# Patient Record
Sex: Female | Born: 2000 | Marital: Single | Smoking: Never smoker
Health system: Southern US, Community
[De-identification: ages and names within clinical notes are randomized; demographics above are authoritative.]

## PROBLEM LIST (undated history)

## (undated) DIAGNOSIS — N946 Dysmenorrhea, unspecified: Secondary | ICD-10-CM

## (undated) DIAGNOSIS — F419 Anxiety disorder, unspecified: Secondary | ICD-10-CM

## (undated) DIAGNOSIS — N926 Irregular menstruation, unspecified: Secondary | ICD-10-CM

## (undated) HISTORY — DX: Anxiety disorder, unspecified: F41.9

## (undated) HISTORY — DX: Irregular menstruation, unspecified: N92.6

## (undated) HISTORY — DX: Dysmenorrhea, unspecified: N94.6

## (undated) HISTORY — PX: WISDOM TOOTH EXTRACTION: SHX21

---

## 2019-12-28 DIAGNOSIS — Z03818 Encounter for observation for suspected exposure to other biological agents ruled out: Secondary | ICD-10-CM | POA: Diagnosis not present

## 2020-05-22 DIAGNOSIS — Z20822 Contact with and (suspected) exposure to covid-19: Secondary | ICD-10-CM | POA: Diagnosis not present

## 2020-06-06 DIAGNOSIS — Z20822 Contact with and (suspected) exposure to covid-19: Secondary | ICD-10-CM | POA: Diagnosis not present

## 2020-06-15 DIAGNOSIS — Z20822 Contact with and (suspected) exposure to covid-19: Secondary | ICD-10-CM | POA: Diagnosis not present

## 2020-07-01 DIAGNOSIS — Z111 Encounter for screening for respiratory tuberculosis: Secondary | ICD-10-CM | POA: Diagnosis not present

## 2020-07-04 DIAGNOSIS — Z111 Encounter for screening for respiratory tuberculosis: Secondary | ICD-10-CM | POA: Diagnosis not present

## 2020-10-28 DIAGNOSIS — Z111 Encounter for screening for respiratory tuberculosis: Secondary | ICD-10-CM | POA: Diagnosis not present

## 2020-10-31 DIAGNOSIS — Z111 Encounter for screening for respiratory tuberculosis: Secondary | ICD-10-CM | POA: Diagnosis not present

## 2021-03-31 DIAGNOSIS — R519 Headache, unspecified: Secondary | ICD-10-CM | POA: Diagnosis not present

## 2021-03-31 DIAGNOSIS — Z20822 Contact with and (suspected) exposure to covid-19: Secondary | ICD-10-CM | POA: Diagnosis not present

## 2021-03-31 DIAGNOSIS — J069 Acute upper respiratory infection, unspecified: Secondary | ICD-10-CM | POA: Diagnosis not present

## 2021-07-10 ENCOUNTER — Other Ambulatory Visit: Payer: Self-pay

## 2021-07-10 ENCOUNTER — Ambulatory Visit (INDEPENDENT_AMBULATORY_CARE_PROVIDER_SITE_OTHER): Payer: BC Managed Care – PPO | Admitting: Obstetrics

## 2021-07-10 ENCOUNTER — Encounter: Payer: Self-pay | Admitting: Obstetrics

## 2021-07-10 ENCOUNTER — Other Ambulatory Visit (HOSPITAL_COMMUNITY)
Admission: RE | Admit: 2021-07-10 | Discharge: 2021-07-10 | Disposition: A | Discharge status: Home / Self Care | Payer: BC Managed Care – PPO | Source: Ambulatory Visit | Attending: Obstetrics | Admitting: Obstetrics

## 2021-07-10 VITALS — BP 118/79 | HR 66 | Ht 67.0 in | Wt 154.1 lb

## 2021-07-10 DIAGNOSIS — Z124 Encounter for screening for malignant neoplasm of cervix: Secondary | ICD-10-CM | POA: Insufficient documentation

## 2021-07-10 DIAGNOSIS — N6310 Unspecified lump in the right breast, unspecified quadrant: Secondary | ICD-10-CM | POA: Diagnosis not present

## 2021-07-10 DIAGNOSIS — Z01419 Encounter for gynecological examination (general) (routine) without abnormal findings: Secondary | ICD-10-CM

## 2021-07-10 MED ORDER — SERTRALINE HCL 100 MG PO TABS: 150.0000 mg | ORAL_TABLET | Freq: Every day | ORAL | 5 refills | Status: DC

## 2021-07-10 NOTE — Progress Notes (Signed)
SUBJECTIVE ? ?HPI ? ?Julia Mcdaniel is a 21 y.o.-year-old female who presents to establish care and for an annual gynecological exam with Pap today. She is on continuously cycled Nextstellis which is working well for her right now. She does note that if she does not take her dose at the exact same time each day, she will have spotting. She states that she has a a lot of vaginal discharge, but there is no odor or itching. She has been taking sertraline for her anxiety. She decreased her dose to 100 mg, but she would like to return to 150 mg. She has no other questions or concerns today. ? ?Medical/Surgical History ?Past Medical History:  ?Diagnosis Date  ? Anxiety   ? Irregular periods/menstrual cycles   ? Painful menstrual periods   ? ?History reviewed. No pertinent surgical history. ?Family History ?Mother - hysterectomy, thyroid issues, bladder problems ?Paternal aunt -Breast cancer in early 79s ? 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 ?Obstetric History ?OB History   ? ? Gravida  ?0  ? Para  ?0  ? Term  ?0  ? Preterm  ?0  ? AB  ?0  ? Living  ?0  ?  ? ? SAB  ?0  ? IAB  ?0  ? Ectopic  ?0  ? Multiple  ?0  ? Live Births  ?0  ?   ?  ?  ?  ? ?GYN/Menstrual History ?Patient's last menstrual period was 06/16/2021 (exact date). ?Does not usually have period with OCPs ?Last Pap: unsure ?Contraception: OCPs ? ?Prevention ?Endorses regular dental and eye exams ?Mammogram: at 50 ?Flu shot/vaccines: up to date ? ?Current Medications ?Outpatient Medications Prior to Visit  ?Medication Sig  ? Ascorbic Acid (VITAMIN C) 100 MG tablet Take 100 mg by mouth daily.  ? Drospirenone-Estetrol (NEXTSTELLIS) 3-14.2 MG TABS Take by mouth.  ? Multiple Vitamins-Minerals (WOMENS MULTIVITAMIN + COLLAGEN PO) Take by mouth.  ? Probiotic Product (PROBIOTIC PO) Take by mouth.  ? sertraline  (ZOLOFT) 100 MG tablet Take 100 mg by mouth daily.  ? ?No facility-administered medications prior to visit.  ?  ? ? Upstream - 07/10/21 1435   ? ?  ? Pregnancy Intention Screening  ? Does the patient want to become pregnant in the next year? No   ? Does the patient's partner want to become pregnant in the next year? No   ? Would the patient like to discuss contraceptive options today? No   ?  ? Contraception Wrap Up  ? Current Method Oral Contraceptive   ? End Method Oral Contraceptive   ? ?  ?  ? ?  ? ?The pregnancy intention screening data noted above was reviewed. Potential methods of contraception were discussed. The patient elected to proceed with Oral Contraceptive.  ? ?ROS ?History obtained from the patient ?General ROS: negative for - chills, fatigue, or night sweats ?Psychological ROS: positive for - anxiety ?Ophthalmic ROS: negative for - blurry vision ?ENT ROS: negative for - headaches, sore throat, or visual changes ?Hematological and Lymphatic ROS: negative for - bleeding problems or swollen lymph nodes ?Endocrine ROS: negative for - breast changes, palpitations, or polydipsia/polyuria ?Breast ROS: negative for breast lumps ?Respiratory ROS: no cough, shortness of breath, or wheezing ?Cardiovascular ROS: no chest pain or dyspnea on exertion ?Gastrointestinal ROS: no abdominal pain, change in bowel habits, or black or bloody stools ?Genito-Urinary  ROS: no dysuria, trouble voiding, or hematuria ?Musculoskeletal ROS: negative for - joint pain or muscle pain ?Dermatological ROS: negative for dry skin and rash ? ?   ? View : No data to display.  ?  ?  ?  ?  ? ?OBJECTIVE ? ?Last Weight  Most recent update: 07/10/2021  2:32 PM  ? ? Weight  ?69.9 kg (154 lb 1.6 oz)  ?      ? ?  ?  ?Body mass index is 24.14 kg/m?.  ? ? ?BP 118/79   Pulse 66   Ht 5\' 7"  (1.702 m)   Wt 154 lb 1.6 oz (69.9 kg)   LMP 06/16/2021 (Exact Date)   BMI 24.14 kg/m?  ?General appearance: alert, cooperative, and appears stated age ?Head:  Normocephalic, without obvious abnormality, atraumatic ?Neck: no adenopathy, supple, symmetrical, trachea midline, and thyroid not enlarged, symmetric, no tenderness/mass/nodules ?Lungs: clear to auscultation bilaterally ?Breasts: No nipple retraction or dimpling, No nipple discharge or bleeding, No axillary or supraclavicular adenopathy, positive findings: 2x1 cm, smooth, rubbery, and mobile nodule located on the right below the areola ?Heart: regular rate and rhythm, S1, S2 normal, no murmur, click, rub or gallop ?Abdomen: soft, non-tender; bowel sounds normal; no masses,  no organomegaly ?Pelvic: cervix normal in appearance, external genitalia normal, no cervical motion tenderness, rectovaginal septum normal, and vagina normal with white watery discharge noted. Pap collected. ?Extremities: extremities normal, atraumatic, no cyanosis or edema ?Pulses: 2+ and symmetric ?Skin: Skin color, texture, turgor normal. No rashes or lesions ?Lymph nodes: Cervical, supraclavicular, and axillary nodes normal. ? ?ASSESSMENT  ?1) Annual exam ?2) Pap due ?3) Breast nodule, right side ?4) Anxiety ? ?PLAN ?1) Physical exam as noted. Labs: TSH, CBC, B12, Vitamin D. Declines STI testing.  ?2) Pap collected. Follow up based on results. ?3) Breast ultrasound ordered. ?4) Increased dose of sertraline to 150 mg. Rx sent to pharmacy. ? ?Return in one year for annual or PRN. ? ?06/18/2021, CNM  ?

## 2021-07-11 LAB — CBC WITH DIFFERENTIAL/PLATELET
Basophils Absolute: 0 10*3/uL (ref 0.0–0.2)
Basos: 1 %
EOS (ABSOLUTE): 0.1 10*3/uL (ref 0.0–0.4)
Eos: 1 %
Hematocrit: 39 % (ref 34.0–46.6)
Hemoglobin: 13.1 g/dL (ref 11.1–15.9)
Immature Grans (Abs): 0 10*3/uL (ref 0.0–0.1)
Immature Granulocytes: 0 %
Lymphocytes Absolute: 3 10*3/uL (ref 0.7–3.1)
Lymphs: 38 %
MCH: 30.1 pg (ref 26.6–33.0)
MCHC: 33.6 g/dL (ref 31.5–35.7)
MCV: 90 fL (ref 79–97)
Monocytes Absolute: 0.8 10*3/uL (ref 0.1–0.9)
Monocytes: 10 %
Neutrophils Absolute: 4 10*3/uL (ref 1.4–7.0)
Neutrophils: 50 %
Platelets: 341 10*3/uL (ref 150–450)
RBC: 4.35 x10E6/uL (ref 3.77–5.28)
RDW: 12.3 % (ref 11.7–15.4)
WBC: 7.9 10*3/uL (ref 3.4–10.8)

## 2021-07-11 LAB — VITAMIN B12: Vitamin B-12: 537 pg/mL (ref 232–1245)

## 2021-07-11 LAB — TSH: TSH: 2.17 u[IU]/mL (ref 0.450–4.500)

## 2021-07-11 LAB — VITAMIN D 25 HYDROXY (VIT D DEFICIENCY, FRACTURES): Vit D, 25-Hydroxy: 31.8 ng/mL (ref 30.0–100.0)

## 2021-07-14 ENCOUNTER — Telehealth: Payer: Self-pay | Admitting: Obstetrics

## 2021-07-14 NOTE — Telephone Encounter (Signed)
Pt called stating that she had called to get breat [place] scheduled and they started order was wrong and that provider needs to sign off on it.  ? ?Pt also states that she can also feel lump on left breast.  ?

## 2021-07-15 ENCOUNTER — Other Ambulatory Visit: Payer: Self-pay | Admitting: Obstetrics

## 2021-07-15 DIAGNOSIS — N63 Unspecified lump in unspecified breast: Secondary | ICD-10-CM

## 2021-07-15 NOTE — Telephone Encounter (Signed)
Pt called again today asking for her breast imaging to be bilateral. Please advise.  ?

## 2021-07-16 NOTE — Telephone Encounter (Signed)
The orders should be in correctly now. Let me know if she has any more difficulty - ? ?Missy

## 2021-07-17 ENCOUNTER — Encounter: Payer: Self-pay | Admitting: Obstetrics

## 2021-07-17 LAB — CYTOLOGY - PAP
Comment: NEGATIVE
Diagnosis: HIGH — AB
High risk HPV: NEGATIVE

## 2021-07-22 ENCOUNTER — Other Ambulatory Visit: Payer: Self-pay | Admitting: Obstetrics

## 2021-07-22 ENCOUNTER — Ambulatory Visit
Admission: RE | Admit: 2021-07-22 | Discharge: 2021-07-22 | Disposition: A | Discharge status: Home / Self Care | Payer: BC Managed Care – PPO | Source: Ambulatory Visit | Attending: Obstetrics | Admitting: Obstetrics

## 2021-07-22 DIAGNOSIS — N6312 Unspecified lump in the right breast, upper inner quadrant: Secondary | ICD-10-CM | POA: Diagnosis not present

## 2021-07-22 DIAGNOSIS — N6310 Unspecified lump in the right breast, unspecified quadrant: Secondary | ICD-10-CM | POA: Insufficient documentation

## 2021-07-22 DIAGNOSIS — N63 Unspecified lump in unspecified breast: Secondary | ICD-10-CM | POA: Insufficient documentation

## 2021-07-22 DIAGNOSIS — N6322 Unspecified lump in the left breast, upper inner quadrant: Secondary | ICD-10-CM | POA: Diagnosis not present

## 2021-07-23 ENCOUNTER — Other Ambulatory Visit: Payer: Self-pay | Admitting: Obstetrics

## 2021-07-23 DIAGNOSIS — R928 Other abnormal and inconclusive findings on diagnostic imaging of breast: Secondary | ICD-10-CM

## 2021-07-23 DIAGNOSIS — N63 Unspecified lump in unspecified breast: Secondary | ICD-10-CM

## 2021-07-24 ENCOUNTER — Telehealth: Payer: BC Managed Care – PPO | Admitting: Physician Assistant

## 2021-07-24 DIAGNOSIS — J029 Acute pharyngitis, unspecified: Secondary | ICD-10-CM

## 2021-07-24 NOTE — Progress Notes (Signed)
?E-Visit for Sore Throat ? ?We are sorry that you are not feeling well.  Here is how we plan to help! ? ?Your symptoms indicate a likely viral infection (Pharyngitis).   Pharyngitis is inflammation in the back of the throat which can cause a sore throat, scratchiness and sometimes difficulty swallowing.   Pharyngitis is typically caused by a respiratory virus and will just run its course.  Please keep in mind that your symptoms could last up to 10 days.  For throat pain, we recommend over the counter oral pain relief medications such as acetaminophen or aspirin, or anti-inflammatory medications such as ibuprofen or naproxen sodium.  Topical treatments such as oral throat lozenges or sprays may be used as needed.  Avoid close contact with loved ones, especially the very young and elderly.  Remember to wash your hands thoroughly throughout the day as this is the number one way to prevent the spread of infection and wipe down door knobs and counters with disinfectant. ? ?After careful review of your answers, I would not recommend an antibiotic for your condition.  In the absence of fever, swollen lymph nodes, swollen tonsils and white patches on the tonsils, strep is much less likely. However, giving your exposure risks, if you start to notice any of these symptoms, please let us know. Antibiotics should not be used to treat conditions that we suspect are caused by viruses like the virus that causes the common cold or flu. However, some people can have Strep with atypical symptoms. You may need formal testing in clinic or office to confirm if your symptoms continue or worsen. ? ?Providers prescribe antibiotics to treat infections caused by bacteria. Antibiotics are very powerful in treating bacterial infections when they are used properly.  To maintain their effectiveness, they should be used only when necessary.  Overuse of antibiotics has resulted in the development of super bugs that are resistant to treatment!    ? ?Home Care: ?Only take medications as instructed by your medical team. ?Do not drink alcohol while taking these medications. ?A steam or ultrasonic humidifier can help congestion.  You can place a towel over your head and breathe in the steam from hot water coming from a faucet. ?Avoid close contacts especially the very young and the elderly. ?Cover your mouth when you cough or sneeze. ?Always remember to wash your hands. ? ?Get Help Right Away If: ?You develop worsening fever or throat pain. ?You develop a severe head ache or visual changes. ?Your symptoms persist after you have completed your treatment plan. ? ?Make sure you ?Understand these instructions. ?Will watch your condition. ?Will get help right away if you are not doing well or get worse. ? ? ?Thank you for choosing an e-visit. ? ?Your e-visit answers were reviewed by a board certified advanced clinical practitioner to complete your personal care plan. Depending upon the condition, your plan could have included both over the counter or prescription medications. ? ?Please review your pharmacy choice. Make sure the pharmacy is open so you can pick up prescription now. If there is a problem, you may contact your provider through [employer] and have the prescription routed to another pharmacy.  Your safety is important to [place]. If you have drug allergies check your prescription carefully.  ? ?For the next 24 hours you can use MyChart to ask questions about today's visit, request a non-urgent call back, or ask for a work or school excuse. ?You will get an email in the next two  days asking about your experience. I hope that your e-visit has been valuable and will speed your recovery. ? ?

## 2021-07-24 NOTE — Progress Notes (Signed)
I have spent 5 minutes in review of e-visit questionnaire, review and updating patient chart, medical decision making and response to patient.   Jb Dulworth Cody Cova Knieriem, PA-C    

## 2021-08-12 ENCOUNTER — Ambulatory Visit
Admission: RE | Admit: 2021-08-12 | Discharge: 2021-08-12 | Disposition: A | Discharge status: Home / Self Care | Payer: BC Managed Care – PPO | Source: Ambulatory Visit | Attending: Obstetrics | Admitting: Obstetrics

## 2021-08-12 DIAGNOSIS — R928 Other abnormal and inconclusive findings on diagnostic imaging of breast: Secondary | ICD-10-CM

## 2021-08-12 DIAGNOSIS — N63 Unspecified lump in unspecified breast: Secondary | ICD-10-CM | POA: Insufficient documentation

## 2021-08-25 ENCOUNTER — Encounter: Payer: Self-pay | Admitting: Obstetrics and Gynecology

## 2021-08-25 NOTE — Progress Notes (Signed)
? ? ? ?  GYNECOLOGY OFFICE COLPOSCOPY PROCEDURE NOTE ? ?21 y.o. G0P0000 here for colposcopy for ASC cannot exclude high grade lesion [facility]) pap smear on 07/10/2021. Discussed role for HPV in cervical dysplasia, need for surveillance. ? ?Patient gave informed written consent, time out was performed.  Placed in lithotomy position. Cervix viewed with speculum and colposcope after application of acetic acid.  ? ?Colposcopy adequate? Yes ? no mosaicism, no punctation, no abnormal vasculature, acetowhite lesion(s) noted at 12 o'clock, and HPV changes noted at 6 o'clock; corresponding biopsies obtained.  ECC specimen obtained. ?All specimens were labeled and sent to pathology. ? ?Chaperone was present during entire procedure. ? ?Patient was given post procedure instructions.  Will follow up pathology and manage accordingly; patient will be contacted with results and recommendations.  Routine preventative health maintenance measures emphasized. ? ? ?Rubie Maid, MD ?[facility]  ? ?  ?

## 2021-08-26 ENCOUNTER — Encounter: Payer: Self-pay | Admitting: Obstetrics and Gynecology

## 2021-08-26 ENCOUNTER — Ambulatory Visit (INDEPENDENT_AMBULATORY_CARE_PROVIDER_SITE_OTHER): Payer: BC Managed Care – PPO | Admitting: Obstetrics and Gynecology

## 2021-08-26 ENCOUNTER — Other Ambulatory Visit (HOSPITAL_COMMUNITY)
Admission: RE | Admit: 2021-08-26 | Discharge: 2021-08-26 | Disposition: A | Discharge status: Home / Self Care | Payer: BC Managed Care – PPO | Source: Ambulatory Visit | Attending: Obstetrics and Gynecology | Admitting: Obstetrics and Gynecology

## 2021-08-26 VITALS — BP 125/77 | HR 75 | Resp 16 | Ht 67.0 in | Wt 152.9 lb

## 2021-08-26 DIAGNOSIS — N870 Mild cervical dysplasia: Secondary | ICD-10-CM

## 2021-08-26 DIAGNOSIS — R87611 Atypical squamous cells cannot exclude high grade squamous intraepithelial lesion on cytologic smear of cervix (ASC-H): Secondary | ICD-10-CM | POA: Diagnosis not present

## 2021-08-26 NOTE — Addendum Note (Signed)
Addended by: Tommie Raymond on: 08/26/2021 11:57 AM ? ? Modules accepted: Orders ? ?

## 2021-08-27 ENCOUNTER — Encounter: Payer: Self-pay | Admitting: Obstetrics and Gynecology

## 2021-08-27 DIAGNOSIS — N870 Mild cervical dysplasia: Secondary | ICD-10-CM | POA: Insufficient documentation

## 2021-08-27 HISTORY — DX: Mild cervical dysplasia: N87.0

## 2021-08-27 LAB — SURGICAL PATHOLOGY

## 2021-09-23 ENCOUNTER — Telehealth: Payer: Self-pay | Admitting: Dermatology

## 2021-09-23 ENCOUNTER — Other Ambulatory Visit: Payer: Self-pay | Admitting: Obstetrics

## 2021-09-23 MED ORDER — NEXTSTELLIS 3-14.2 MG PO TABS: 1.0000 | ORAL_TABLET | Freq: Every day | ORAL | 0 refills | Status: DC

## 2021-09-23 NOTE — Telephone Encounter (Signed)
PT is asking for a few BC pills to hold her until her RX arrives via mail. PT received a notice that is will be late . Pt has made a appt to follow up and switch BC for 06/22 . Please return call

## 2021-10-09 ENCOUNTER — Encounter: Payer: Self-pay | Admitting: Obstetrics

## 2021-10-09 ENCOUNTER — Ambulatory Visit (INDEPENDENT_AMBULATORY_CARE_PROVIDER_SITE_OTHER): Payer: BC Managed Care – PPO | Admitting: Obstetrics

## 2021-10-09 VITALS — BP 117/77 | HR 84 | Ht 67.0 in | Wt 157.2 lb

## 2021-10-09 DIAGNOSIS — Z3009 Encounter for other general counseling and advice on contraception: Secondary | ICD-10-CM

## 2021-10-09 MED ORDER — NEXTSTELLIS 3-14.2 MG PO TABS
1.0000 | ORAL_TABLET | Freq: Every day | ORAL | 3 refills | Status: DC
Start: 2021-10-09 — End: 2022-08-04

## 2021-10-09 NOTE — Progress Notes (Signed)
Subjective:    Julia Mcdaniel is a 21 y.o. female who presents for contraception counseling. She has been on Nextstellis for a while, and it is working well for her. However, sometimes her mail order pharmacy does not get it to her on time and she misses it for 2 weeks. She has to take it at the same time every day or she gets another period. She may want to continue the Nextstellis and have it prescribed at a local pharmacy, but she would like to review all her options. She is sexually active. Pertinent past medical history: none. She would also like to discuss the results of her recent colposcopy.  Menstrual History: OB History     Gravida  0   Para  0   Term  0   Preterm  0   AB  0   Living  0      SAB  0   IAB  0   Ectopic  0   Multiple  0   Live Births  0          LMP: 09/22/21 Period Duration (Days): 5-6 Period Pattern: (!) Irregular Menstrual Flow: Moderate Menstrual Control: Thin pad, Tampon Menstrual Control Change Freq (Hours): 2-3 Dysmenorrhea: (!) Moderate Dysmenorrhea Symptoms: Cramping  The following portions of the patient's history were reviewed and updated as appropriate: allergies, current medications, past family history, past medical history, past social history, past surgical history, and problem list.  Review of Systems Pertinent items are noted in HPI.   Objective:    No exam performed today,  not indicated for contraceptive counseling .   Assessment:    21 y.o., continuing OCP (estrogen/progesterone), no contraindications.   Plan:   Reviewed all available contraceptive options. Julia Mcdaniel would like to continue with Nextstellis at this time. Rx sent to local pharmacy. Reviewed colposcopy results, recommended follow up, and implications for future pregnancies.  All questions answered.   RTC PRN or for annual visit.  Julia Mcdaniel, CNM

## 2022-05-14 DIAGNOSIS — R21 Rash and other nonspecific skin eruption: Secondary | ICD-10-CM | POA: Diagnosis not present

## 2022-05-14 DIAGNOSIS — Z1331 Encounter for screening for depression: Secondary | ICD-10-CM | POA: Diagnosis not present

## 2022-05-14 DIAGNOSIS — Z1330 Encounter for screening examination for mental health and behavioral disorders, unspecified: Secondary | ICD-10-CM | POA: Diagnosis not present

## 2022-05-14 DIAGNOSIS — N63 Unspecified lump in unspecified breast: Secondary | ICD-10-CM | POA: Diagnosis not present

## 2022-06-22 ENCOUNTER — Telehealth: Payer: Self-pay | Admitting: Obstetrics

## 2022-06-22 NOTE — Telephone Encounter (Signed)
Left message for patient to call office to r/s appt on 07/16/22

## 2022-06-29 NOTE — Telephone Encounter (Signed)
I contacted patient via phone. I left voicemail for patient to call back to be rescheduled.

## 2022-07-01 NOTE — Telephone Encounter (Signed)
The patient called and rescheduled for ABC on 4/16 for annual

## 2022-07-16 ENCOUNTER — Encounter: Payer: Self-pay | Admitting: Obstetrics

## 2022-07-23 ENCOUNTER — Telehealth: Payer: Self-pay

## 2022-07-23 ENCOUNTER — Other Ambulatory Visit: Payer: Self-pay | Admitting: Obstetrics and Gynecology

## 2022-07-23 MED ORDER — SERTRALINE HCL 100 MG PO TABS: 150.0000 mg | ORAL_TABLET | Freq: Every day | ORAL | 0 refills | Status: DC

## 2022-07-23 NOTE — Telephone Encounter (Signed)
Called pt, no answer, LVMTRC. 

## 2022-07-23 NOTE — Telephone Encounter (Signed)
Pt aware.

## 2022-07-23 NOTE — Progress Notes (Signed)
Rx RF sertraline till 4/24 annual 

## 2022-07-23 NOTE — Telephone Encounter (Signed)
Fax refill request received for: sertraline (ZOLOFT) 100 MG tablet #90 Last Fill 06/24/22.

## 2022-07-23 NOTE — Telephone Encounter (Signed)
Rx RF eRxd.  

## 2022-08-03 NOTE — Progress Notes (Unsigned)
   PCP:  Pcp, No   No chief complaint on file.    HPI:      Ms. Julia Mcdaniel is a 22 y.o. G0P0000 whose LMP was No LMP recorded., presents today for her annual examination.  Her menses are {norm/abn:715}, lasting {number: 22536} days.  Dysmenorrhea {dysmen:716}. She {does:18564} have intermenstrual bleeding.  Sex activity: {sex active: 315163}.  Last Pap: 07/10/21 Results were: ASC cannot exclude high grade lesion [facility]) /neg HPV DNA ; colpo bx 5/23 with CIN1 with Dr. Valentino Saxon Hx of STDs: {STD hx:14358}  There is no FH of breast cancer. There is no FH of ovarian cancer. The patient {does:18564} do self-breast exams.  Tobacco use: {tob:20664} Alcohol use: {Alcohol:11675} No drug use.  Exercise: {exercise:31265}  She {does:18564} get adequate calcium and Vitamin D in her diet.  Patient Active Problem List   Diagnosis Date Noted   CIN I (cervical intraepithelial neoplasia I) 08/27/2021    No past surgical history on file.  Family History  Problem Relation Age of Onset   Thyroid disease Mother    Other Mother        hysterectomy   Breast cancer Paternal Aunt 29    Social History   Socioeconomic History   Marital status: Single    Spouse name: Not on file   Number of children: Not on file   Years of education: Not on file   Highest education level: Not on file  Occupational History   Not on file  Tobacco Use   Smoking status: Never   Smokeless tobacco: Never  Vaping Use   Vaping Use: Never used  Substance and Sexual Activity   Alcohol use: Yes    Comment: occasional   Drug use: Never   Sexual activity: Yes    Birth control/protection: Pill  Other Topics Concern   Not on file  Social History Narrative   Not on file   Social Determinants of Health   Financial Resource Strain: Not on file  Food Insecurity: Not on file  Transportation Needs: Not on file  Physical Activity: Not on file  Stress: Not on file  Social Connections: Not on file  Intimate  Partner Violence: Not on file     Current Outpatient Medications:    Ascorbic Acid (VITAMIN C) 100 MG tablet, Take 100 mg by mouth daily., Disp: , Rfl:    Drospirenone-Estetrol (NEXTSTELLIS) 3-14.2 MG TABS, Take 1 tablet by mouth daily., Disp: 84 tablet, Rfl: 3   Multiple Vitamins-Minerals (WOMENS MULTIVITAMIN + COLLAGEN PO), Take by mouth., Disp: , Rfl:    Probiotic Product (PROBIOTIC PO), Take by mouth., Disp: , Rfl:    sertraline (ZOLOFT) 100 MG tablet, Take 1.5 tablets (150 mg total) by mouth daily., Disp: 135 tablet, Rfl: 0     ROS:  Review of Systems BREAST: No symptoms   Objective: There were no vitals taken for this visit.   OBGyn Exam  Results: No results found for this or any previous visit (from the past 24 hour(s)).  Assessment/Plan: No diagnosis found.  No orders of the defined types were placed in this encounter.            GYN counsel {counseling: 16159}     F/U  No follow-ups on file.  Kerrion Kemppainen B. Tc [place], PA-C 08/03/2022 8:16 PM

## 2022-08-04 ENCOUNTER — Encounter: Payer: Self-pay | Admitting: Obstetrics and Gynecology

## 2022-08-04 ENCOUNTER — Other Ambulatory Visit (HOSPITAL_COMMUNITY)
Admission: RE | Admit: 2022-08-04 | Discharge: 2022-08-04 | Disposition: A | Discharge status: Home / Self Care | Payer: BC Managed Care – PPO | Source: Ambulatory Visit | Attending: Obstetrics | Admitting: Obstetrics

## 2022-08-04 ENCOUNTER — Ambulatory Visit (INDEPENDENT_AMBULATORY_CARE_PROVIDER_SITE_OTHER): Payer: BC Managed Care – PPO | Admitting: Obstetrics and Gynecology

## 2022-08-04 VITALS — BP 110/80 | Ht 67.0 in | Wt 163.0 lb

## 2022-08-04 DIAGNOSIS — Z3041 Encounter for surveillance of contraceptive pills: Secondary | ICD-10-CM

## 2022-08-04 DIAGNOSIS — Z124 Encounter for screening for malignant neoplasm of cervix: Secondary | ICD-10-CM | POA: Diagnosis not present

## 2022-08-04 DIAGNOSIS — N870 Mild cervical dysplasia: Secondary | ICD-10-CM

## 2022-08-04 DIAGNOSIS — N63 Unspecified lump in unspecified breast: Secondary | ICD-10-CM

## 2022-08-04 DIAGNOSIS — Z803 Family history of malignant neoplasm of breast: Secondary | ICD-10-CM | POA: Insufficient documentation

## 2022-08-04 DIAGNOSIS — Z01411 Encounter for gynecological examination (general) (routine) with abnormal findings: Secondary | ICD-10-CM

## 2022-08-04 DIAGNOSIS — Z01419 Encounter for gynecological examination (general) (routine) without abnormal findings: Secondary | ICD-10-CM

## 2022-08-04 DIAGNOSIS — Z1151 Encounter for screening for human papillomavirus (HPV): Secondary | ICD-10-CM | POA: Insufficient documentation

## 2022-08-04 DIAGNOSIS — Z113 Encounter for screening for infections with a predominantly sexual mode of transmission: Secondary | ICD-10-CM | POA: Insufficient documentation

## 2022-08-04 MED ORDER — NEXTSTELLIS 3-14.2 MG PO TABS: 1.0000 | ORAL_TABLET | Freq: Every day | ORAL | 3 refills | Status: DC

## 2022-08-04 NOTE — Patient Instructions (Signed)
I value your feedback and you entrusting us with your care. If you get a [facility] patient survey, I would appreciate you taking the time to let us know about your experience today. Thank you! ? ? ?

## 2022-08-06 NOTE — Addendum Note (Signed)
Addended by: Althea Grimmer B on: 08/06/2022 11:54 AM   Modules accepted: Orders

## 2022-08-07 LAB — CYTOLOGY - PAP
Chlamydia: NEGATIVE
Comment: NEGATIVE
Comment: NORMAL
Diagnosis: REACTIVE
Neisseria Gonorrhea: NEGATIVE

## 2022-09-15 ENCOUNTER — Ambulatory Visit: Payer: BC Managed Care – PPO | Admitting: Obstetrics and Gynecology

## 2022-09-24 ENCOUNTER — Ambulatory Visit
Admission: RE | Admit: 2022-09-24 | Discharge: 2022-09-24 | Disposition: A | Discharge status: Home / Self Care | Payer: BC Managed Care – PPO | Source: Ambulatory Visit | Attending: Obstetrics and Gynecology | Admitting: Obstetrics and Gynecology

## 2022-09-24 ENCOUNTER — Encounter: Payer: Self-pay | Admitting: Obstetrics and Gynecology

## 2022-09-24 DIAGNOSIS — N63 Unspecified lump in unspecified breast: Secondary | ICD-10-CM

## 2022-09-24 DIAGNOSIS — N6311 Unspecified lump in the right breast, upper outer quadrant: Secondary | ICD-10-CM | POA: Diagnosis not present

## 2022-09-24 DIAGNOSIS — N6322 Unspecified lump in the left breast, upper inner quadrant: Secondary | ICD-10-CM | POA: Diagnosis not present

## 2022-09-24 DIAGNOSIS — N6324 Unspecified lump in the left breast, lower inner quadrant: Secondary | ICD-10-CM | POA: Diagnosis not present

## 2022-09-30 ENCOUNTER — Ambulatory Visit (INDEPENDENT_AMBULATORY_CARE_PROVIDER_SITE_OTHER): Payer: BC Managed Care – PPO | Admitting: Surgery

## 2022-09-30 ENCOUNTER — Encounter: Payer: Self-pay | Admitting: Surgery

## 2022-09-30 VITALS — Temp 98.5°F | Ht 67.0 in | Wt 159.0 lb

## 2022-09-30 DIAGNOSIS — N6322 Unspecified lump in the left breast, upper inner quadrant: Secondary | ICD-10-CM

## 2022-09-30 DIAGNOSIS — Z803 Family history of malignant neoplasm of breast: Secondary | ICD-10-CM | POA: Diagnosis not present

## 2022-09-30 DIAGNOSIS — N6313 Unspecified lump in the right breast, lower outer quadrant: Secondary | ICD-10-CM | POA: Diagnosis not present

## 2022-09-30 DIAGNOSIS — N63 Unspecified lump in unspecified breast: Secondary | ICD-10-CM

## 2022-09-30 NOTE — Progress Notes (Signed)
Patient ID: Julia Mcdaniel, female   DOB: 08-22-2000, 22 y.o.   MRN: [id number]  HPI Julia Mcdaniel is a 22 y.o. female in consultation at the request of Ms. Copland PA-C.  Bilateral breast abnormalities.  The patient reports that her GYN provider about a year ago felt an abnormality on her left breast and that prompted ultrasound both breast.  Initially there were areas that breast radiology felt needed to be biopsy.  The actual radiologist who was going to do the biopsy at the same day examine the images and felt that a biopsy was not necessary.  Please note that that was over a year ago.  She  had another ultrasound last week that have personally reviewed showing bilateral breast nodules.  On the left side 10 o'clock measures 23 x 18 mm and on the right side 6:30 12x4mm.  Do not exhibit concerning characteristics He denies any breast pain she denies any lymphadenopathy.  She denies any nipple discharge.  No weight loss.  She is able to perform more than 4 METS of activity without shortness of breath or chest pain.  She is a Engineer, site  fourth grade. CBC is normal SHe is on birth control.  Family history is significant for a paternal aunt diagnosed at age 4 with breast cancer. In arche at 58 G0  HPI  Past Medical History:  Diagnosis Date   Anxiety    CIN I (cervical intraepithelial neoplasia I) 08/27/2021   Irregular periods/menstrual cycles    Painful menstrual periods     Past Surgical History:  Procedure Laterality Date   WISDOM TOOTH EXTRACTION      Family History  Problem Relation Age of Onset   Thyroid disease Mother    Other Mother        hysterectomy   Breast cancer Paternal Aunt 17    Social History Social History   Tobacco Use   Smoking status: Never    Passive exposure: Never   Smokeless tobacco: Never  Vaping Use   Vaping Use: Never used  Substance Use Topics   Alcohol use: Yes    Comment: occasional   Drug use: Never    No Known  Allergies  Current Outpatient Medications  Medication Sig Dispense Refill   Ascorbic Acid (VITAMIN C) 100 MG tablet Take 100 mg by mouth daily.     Drospirenone-Estetrol (NEXTSTELLIS) 3-14.2 MG TABS Take 1 tablet by mouth daily. 84 tablet 3   Ferrous Sulfate (IRON PO) Take by mouth.     Multiple Vitamins-Minerals (WOMENS MULTIVITAMIN + COLLAGEN PO) Take by mouth.     Probiotic Product (PROBIOTIC PO) Take by mouth.     sertraline (ZOLOFT) 100 MG tablet Take 1.5 tablets (150 mg total) by mouth daily. 135 tablet 0   No current facility-administered medications for this visit.     Review of Systems Full ROS  was asked and was negative except for the information on the HPI  Physical Exam Temperature 98.5 F (36.9 C), height 5\' 7"  (1.702 m), weight 159 lb (72.1 kg), SpO2 97 %. CONSTITUTIONAL: NAD. EYES: Pupils are equal, round, Sclera are non-icteric. EARS, NOSE, MOUTH AND THROAT: The oropharynx is clear. The oral mucosa is pink and moist. Hearing is intact to voice. LYMPH NODES:  Lymph nodes in the neck are normal. RESPIRATORY:  Lungs are clear. There is normal respiratory effort, with equal breath sounds bilaterally, and without pathologic use of accessory muscles. CARDIOVASCULAR: Heart is regular without murmurs, gallops, or rubs. BREAST:  Right side:There is a small 12 mm fibrocystic lesion on the right breast located sixth 32 cm from the nipple.  This seems to be part of her normal dense breast tissue.  It is mobile and not attached to deep structures. Left Side: there is a small 20 mm fibrocystic lesion located 10:00 .  This seems to be part of her normal dense breast tissue.  It is mobile and not attached to deep structures. There Is normal skin normal nipple and normal axillary basins bilaterally GI: The abdomen is  soft, nontender, and nondistended. There are no palpable masses. There is no hepatosplenomegaly. There are normal bowel sounds in all quadrants. GU: Rectal deferred.    MUSCULOSKELETAL: Normal muscle strength and tone. No cyanosis or edema.   SKIN: Turgor is good and there are no pathologic skin lesions or ulcers. NEUROLOGIC: Motor and sensation is grossly normal. Cranial nerves are grossly intact. PSYCH:  Oriented to person, place and time. Affect is normal.  Data Reviewed  I have personally reviewed the patient's imaging, laboratory findings and medical records.    Assessment/Plan 22 year old female with benign bilateral breast nodules that are currently asymptomatic.  I do not think that they married excisional biopsy.  I had an extensive discussion regarding her disease process.  Her physical exam is also reassuring as well as the imaging studies.  Like to see her back in about 6 months and perform another physical exam to ensure stability of the lesions.  Also discussed with her about the possibility of excisional biopsy if the masses were to enlarge or if she had persistent concerns about potential malignancy.  I spent 55 min in this encounter including personally reviewing images and records, coordination of care, placing orders and performing documentation. Copy of this report sent to referring provider   Sterling Big, MD FACS General Surgeon 09/30/2022, 2:13 PM

## 2022-09-30 NOTE — Patient Instructions (Signed)
We will have you follow up here in 6 months with an exam and discussion.  Continue to do your monthly breast exams at the same time each month.   Please call and ask to speak with a nurse if you develop questions or concerns.    Breast Self-Awareness Breast self-awareness is knowing how your breasts look and feel. You need to: Check your breasts on a regular basis. Tell your doctor about any changes. Become familiar with the look and feel of your breasts. This can help you catch a breast problem while it is still small and can be treated. You should do breast self-exams even if you have breast implants. What you need: A mirror. A well-lit room. A pillow or other soft object. How to do a breast self-exam Follow these steps to do a breast self-exam: Look for changes  Take off all the clothes above your waist. Stand in front of a mirror in a room with good lighting. Put your hands down at your sides. Compare your breasts in the mirror. Look for any difference between them, such as: A difference in shape. A difference in size. Wrinkles, dips, and bumps in one breast and not the other. Look at each breast for changes in the skin, such as: Redness. Scaly areas. Skin that has gotten thicker. Dimpling. Open sores (ulcers). Look for changes in your nipples, such as: Fluid coming out of a nipple. Fluid around a nipple. Bleeding. Dimpling. Redness. A nipple that looks pushed in (retracted), or that has changed position. Feel for changes Lie on your back. Feel each breast. To do this: Pick a breast to feel. Place a pillow under the shoulder closest to that breast. Put the arm closest to that breast behind your head. Feel the nipple area of that breast using the hand of your other arm. Feel the area with the pads of your three middle fingers by making small circles with your fingers. Use light, medium, and firm pressure. Continue the overlapping circles, moving downward over the  breast. Keep making circles with your fingers. Stop when you feel your ribs. Start making circles with your fingers again, this time going upward until you reach your collarbone. Then, make circles outward across your breast and into your armpit area. Squeeze your nipple. Check for discharge and lumps. Repeat these steps to check your other breast. Sit or stand in the tub or shower. With soapy water on your skin, feel each breast the same way you did when you were lying down. Write down what you find Writing down what you find can help you remember what to tell your doctor. Write down: What is normal for each breast. Any changes you find in each breast. These include: The kind of changes you find. A tender or painful breast. Any lump you find. Write down its size and where it is. When you last had your monthly period (menstrual cycle). General tips If you are breastfeeding, the best time to check your breasts is after you feed your baby or after you use a breast pump. If you get monthly bleeding, the best time to check your breasts is 5-7 days after your monthly cycle ends. With time, you will become comfortable with the self-exam. You will also start to know if there are changes in your breasts. Contact a doctor if: You see a change in the shape or size of your breasts or nipples. You see a change in the skin of your breast or nipples, such as  red or scaly skin. You have fluid coming from your nipples that is not normal. You find a new lump or thick area. You have breast pain. You have any concerns about your breast health. Summary Breast self-awareness includes looking for changes in your breasts and feeling for changes within your breasts. You should do breast self-awareness in front of a mirror in a well-lit room. If you get monthly periods (menstrual cycles), the best time to check your breasts is 5-7 days after your period ends. Tell your doctor about any changes you see in your  breasts. Changes include changes in size, changes on the skin, painful or tender breasts, or fluid from your nipples that is not normal. This information is not intended to replace advice given to you by your health care provider. Make sure you discuss any questions you have with your health care provider. Document Revised: 09/11/2021 Document Reviewed: 02/06/2021 Elsevier Patient Education  2024 [employer].

## 2022-10-27 ENCOUNTER — Other Ambulatory Visit: Payer: Self-pay | Admitting: Obstetrics and Gynecology

## 2023-03-29 ENCOUNTER — Encounter: Payer: Self-pay | Admitting: Surgery

## 2023-03-29 ENCOUNTER — Ambulatory Visit: Payer: Self-pay | Admitting: Surgery

## 2023-03-29 VITALS — BP 100/69 | HR 70 | Temp 98.5°F | Ht 67.0 in | Wt 157.0 lb

## 2023-03-29 DIAGNOSIS — N6322 Unspecified lump in the left breast, upper inner quadrant: Secondary | ICD-10-CM

## 2023-03-29 DIAGNOSIS — N6314 Unspecified lump in the right breast, lower inner quadrant: Secondary | ICD-10-CM

## 2023-03-29 DIAGNOSIS — N63 Unspecified lump in unspecified breast: Secondary | ICD-10-CM

## 2023-03-29 NOTE — Patient Instructions (Signed)
 Breast Self-Awareness Breast self-awareness is knowing how your breasts look and feel. You need to: Check your breasts on a regular basis. Tell your doctor about any changes. Become familiar with the look and feel of your breasts. This can help you catch a breast problem while it is still small and can be treated. You should do breast self-exams even if you have breast implants. What you need: A mirror. A well-lit room. A pillow or other soft object. How to do a breast self-exam Follow these steps to do a breast self-exam: Look for changes  Take off all the clothes above your waist. Stand in front of a mirror in a room with good lighting. Put your hands down at your sides. Compare your breasts in the mirror. Look for any difference between them, such as: A difference in shape. A difference in size. Wrinkles, dips, and bumps in one breast and not the other. Look at each breast for changes in the skin, such as: Redness. Scaly areas. Skin that has gotten thicker. Dimpling. Open sores (ulcers). Look for changes in your nipples, such as: Fluid coming out of a nipple. Fluid around a nipple. Bleeding. Dimpling. Redness. A nipple that looks pushed in (retracted), or that has changed position. Feel for changes Lie on your back. Feel each breast. To do this: Pick a breast to feel. Place a pillow under the shoulder closest to that breast. Put the arm closest to that breast behind your head. Feel the nipple area of that breast using the hand of your other arm. Feel the area with the pads of your three middle fingers by making small circles with your fingers. Use light, medium, and firm pressure. Continue the overlapping circles, moving downward over the breast. Keep making circles with your fingers. Stop when you feel your ribs. Start making circles with your fingers again, this time going upward until you reach your collarbone. Then, make circles outward across your breast and into your  armpit area. Squeeze your nipple. Check for discharge and lumps. Repeat these steps to check your other breast. Sit or stand in the tub or shower. With soapy water on your skin, feel each breast the same way you did when you were lying down. Write down what you find Writing down what you find can help you remember what to tell your doctor. Write down: What is normal for each breast. Any changes you find in each breast. These include: The kind of changes you find. A tender or painful breast. Any lump you find. Write down its size and where it is. When you last had your monthly period (menstrual cycle). General tips If you are breastfeeding, the best time to check your breasts is after you feed your baby or after you use a breast pump. If you get monthly bleeding, the best time to check your breasts is 5-7 days after your monthly cycle ends. With time, you will become comfortable with the self-exam. You will also start to know if there are changes in your breasts. Contact a doctor if: You see a change in the shape or size of your breasts or nipples. You see a change in the skin of your breast or nipples, such as red or scaly skin. You have fluid coming from your nipples that is not normal. You find a new lump or thick area. You have breast pain. You have any concerns about your breast health. Summary Breast self-awareness includes looking for changes in your breasts and feeling for changes  within your breasts. You should do breast self-awareness in front of a mirror in a well-lit room. If you get monthly periods (menstrual cycles), the best time to check your breasts is 5-7 days after your period ends. Tell your doctor about any changes you see in your breasts. Changes include changes in size, changes on the skin, painful or tender breasts, or fluid from your nipples that is not normal. This information is not intended to replace advice given to you by your health care provider. Make sure  you discuss any questions you have with your health care provider. Document Revised: 09/11/2021 Document Reviewed: 02/06/2021 Elsevier Patient Education  2024 [employer].

## 2023-04-01 NOTE — Progress Notes (Signed)
Outpatient Surgical Follow Up    Julia Mcdaniel is an 22 y.o. female.   Chief Complaint  Patient presents with   Follow-up    HPI: Julia Mcdaniel is a 22 y.o. very nice female following up  for Bilateral breast nodules.    Initially there were areas that breast radiology felt needed to be biopsy.  The actual radiologist who was going to do the biopsy at the same day reviewed the images and felt that a biopsy was not necessary.  Please note that that was over a year ago.  She  had another ultrasound l6 months ago that I  have personally reviewed showing bilateral breast nodules.  On the left side 10 o'clock measures 23 x 18 mm and on the right side 6:30 12x3mm. They  Do not exhibit concerning characteristics SHe denies any breast pain she denies any lymphadenopathy.  She denies any nipple discharge.  No weight loss.  SHe is on birth control.  Family history is significant for a paternal aunt diagnosed at age 38 with breast cancer but no BRCA related. Menarche age 59  Past Medical History:  Diagnosis Date   Anxiety    CIN I (cervical intraepithelial neoplasia I) 08/27/2021   Irregular periods/menstrual cycles    Painful menstrual periods     Past Surgical History:  Procedure Laterality Date   WISDOM TOOTH EXTRACTION      Family History  Problem Relation Age of Onset   Thyroid disease Mother    Other Mother        hysterectomy   Breast cancer Paternal Aunt 51    Social History:  reports that she has never smoked. She has never been exposed to tobacco smoke. She has never used smokeless tobacco. She reports current alcohol use. She reports that she does not use drugs.  Allergies: No Known Allergies  Medications reviewed.    ROS Full ROS performed and is otherwise negative other than what is stated in HPI   BP 100/69   Pulse 70   Temp 98.5 F (36.9 C)   Ht 5\' 7"  (1.702 m)   Wt 157 lb (71.2 kg)   LMP 03/17/2023 (Exact Date)   SpO2 98%   BMI 24.59 kg/m   Physical  Exam CONSTITUTIONAL: NAD. EYES: Pupils are equal, round, Sclera are non-icteric. EARS, NOSE, MOUTH AND THROAT: The oropharynx is clear. The oral mucosa is pink and moist. Hearing is intact to voice. LYMPH NODES:  Lymph nodes in the neck are normal. RESPIRATORY:  Lungs are clear. There is normal respiratory effort, with equal breath sounds bilaterally, and without pathologic use of accessory muscles. CARDIOVASCULAR: Heart is regular without murmurs, gallops, or rubs. BREAST:  Right side:There is a small 12 mm fibrocystic lesion on the right breast located six o'clock 2 cm from the nipple.  This seems to be part of her normal dense breast tissue.  It is mobile and not attached to deep structures. Left Side: there is a small 15 mm fibrocystic lesion located 10:00 .  ( It has decreased in size compared to my prior exam)  It is mobile and not attached to deep structures. There Is normal skin normal nipple and normal axillary basins bilaterally GI: The abdomen is  soft, nontender, and nondistended. There are no palpable masses. There is no hepatosplenomegaly. There are normal bowel sounds in all quadrants. GU: Rectal deferred.   MUSCULOSKELETAL: Normal muscle strength and tone. No cyanosis or edema.   SKIN: Turgor is good and there are  no pathologic skin lesions or ulcers. NEUROLOGIC: Motor and sensation is grossly normal. Cranial nerves are grossly intact. PSYCH:  Oriented to person, place and time. Affect is normal.    Assessment/Plan: 22 year old female with benign bilateral breast nodules that are currently asymptomatic and the left side is actually decreasing in size. Given benign characteristics  I do not think that they merit any biopsies.  SHe can go back to self exams and yearly physicals and mammo starting at 38.     I spent 20 min in this encounter including personally reviewing images and records, coordination of care, placing orders and performing documentation.       Sterling Big, MD [facility] General Surgeon

## 2023-06-09 ENCOUNTER — Telehealth: Payer: Self-pay

## 2023-06-09 DIAGNOSIS — Z3041 Encounter for surveillance of contraceptive pills: Secondary | ICD-10-CM

## 2023-06-09 MED ORDER — NEXTSTELLIS 3-14.2 MG PO TABS: 1.0000 | ORAL_TABLET | Freq: Every day | ORAL | 0 refills | Status: DC

## 2023-06-09 NOTE — Telephone Encounter (Signed)
Chart reviewed: Drospirenone-Estetrol (NEXTSTELLIS) 3-14.2 MG TABS 1 tablet, Daily 3 ordered         Summary: Take 1 tablet by mouth daily., Starting Tue 08/04/2022, Normal Dose, Route, Frequency: 1 tablet, Oral, DailyStart: 04/16/2024Ord/Sold: 08/04/2022 (O)Ordered On: 04/16/2024Pharmacy: [id number] - [place], [place] - [address] AT [facility] [address]: Dx Associated: Taking: Long-term: Med Note:        Change Patient Sig: Take 1 tablet by mouth daily. Ordering Department: [facility] Authorized By: Trenda Moots Dispense: 88 tablet   Pharmacy advised. I was transferred from call center to prescribing pharmacy to get the error corrected. Placed on hold for extended period of time. Per pharmacy patient received fills 4/24,7/24,9/24,12/24. Authorized one refill as next annual not due til 08/04/23.

## 2023-06-09 NOTE — Telephone Encounter (Signed)
Patient aware. Advised needs appointment for annual ~08/04/23 for any additional refills.

## 2023-06-09 NOTE — Telephone Encounter (Signed)
TRIAGE VOICEMAIL: Patient states she tried to refill rx thru pharmacy who advised her rx has expired. Requesting refill.

## 2023-08-18 ENCOUNTER — Other Ambulatory Visit: Payer: Self-pay | Admitting: Obstetrics and Gynecology

## 2023-08-18 ENCOUNTER — Other Ambulatory Visit: Payer: Self-pay

## 2023-08-18 DIAGNOSIS — Z3041 Encounter for surveillance of contraceptive pills: Secondary | ICD-10-CM

## 2023-08-18 MED ORDER — NEXTSTELLIS 3-14.2 MG PO TABS: 1.0000 | ORAL_TABLET | Freq: Every day | ORAL | 0 refills | Status: DC

## 2023-08-27 ENCOUNTER — Other Ambulatory Visit: Payer: Self-pay

## 2023-08-27 ENCOUNTER — Other Ambulatory Visit: Payer: Self-pay | Admitting: Obstetrics and Gynecology

## 2023-08-27 DIAGNOSIS — Z3041 Encounter for surveillance of contraceptive pills: Secondary | ICD-10-CM

## 2023-08-27 MED ORDER — NEXTSTELLIS 3-14.2 MG PO TABS: 1.0000 | ORAL_TABLET | Freq: Every day | ORAL | 0 refills | Status: DC

## 2023-09-07 ENCOUNTER — Ambulatory Visit: Payer: Self-pay | Admitting: Obstetrics and Gynecology

## 2023-09-27 NOTE — Progress Notes (Unsigned)
 PCP:  Pcp, No   No chief complaint on file.    HPI:      Ms. Dietrich Ke is a 23 y.o. G0P0000 whose LMP was No LMP recorded., presents today for her annual examination.  Her menses are absent with cont dosing OCPs, occas BTB if late for pill only; no dysmen.  Hx of menorrhagia off BC.   Sex activity: single partner, contraception - OCP (estrogen/progesterone). No pain/bleeding/dryness. Last Pap: 08/04/22 Results were NILM 07/10/21 Results were: ASC cannot exclude high grade lesion [facility]) /neg HPV DNA ; colpo bx 5/23 with CIN1 with Dr. Denman Fischer Hx of STDs: HPV  There is a FH of breast cancer in her pat aunt twice, and now with metastatic recurrence, genetic testing not done. There is no FH of ovarian cancer. The patient does do self-breast exams. Pt with bilat breast masses 4/23. Has breast u/s and was scheduled for bx but canceled since not necessary. 6 month f/u recommended but not done. Pt can still feel LT breast mass but not RT breast mass.   Tobacco use: The patient denies current or previous tobacco use. Alcohol use: none No drug use.  Exercise: moderately active  She does get adequate calcium and Vitamin D in her diet.  Patient Active Problem List   Diagnosis Date Noted   Family history of breast cancer 08/04/2022   Dysplasia of cervix, low grade (CIN 1) 08/27/2021    Past Surgical History:  Procedure Laterality Date   WISDOM TOOTH EXTRACTION      Family History  Problem Relation Age of Onset   Thyroid  disease Mother    Other Mother        hysterectomy   Breast cancer Paternal Aunt 48    Social History   Socioeconomic History   Marital status: Single    Spouse name: Not on file   Number of children: Not on file   Years of education: Not on file   Highest education level: Not on file  Occupational History   Not on file  Tobacco Use   Smoking status: Never    Passive exposure: Never   Smokeless tobacco: Never  Vaping Use   Vaping status: Never Used   Substance and Sexual Activity   Alcohol use: Yes    Comment: occasional   Drug use: Never   Sexual activity: Yes    Birth control/protection: Pill, Condom  Other Topics Concern   Not on file  Social History Narrative   Not on file   Social Drivers of Health   Financial Resource Strain: Low Risk  (05/12/2022)   Received from [facility], [facility]   Overall Financial Resource Strain (CARDIA)    Difficulty of Paying Living Expenses: Not very hard  Food Insecurity: No Food Insecurity (05/12/2022)   Received from [facility], [facility]   Hunger Vital Sign    Worried About Running Out of Food in the Last Year: Never true    Ran Out of Food in the Last Year: Never true  Transportation Needs: No Transportation Needs (05/12/2022)   Received from [facility], [facility]   [facility] - Transportation    In the past 12 months, has lack of transportation kept you from medical appointments or from getting medications?: No    Lack of Transportation (Non-Medical): No  Physical Activity: Not on file  Stress: Not on file  Social Connections: Not on file  Intimate  Partner Violence: Not on file     Current Outpatient Medications:    Ascorbic Acid (VITAMIN C) 100 MG tablet, Take 100 mg by mouth daily., Disp: , Rfl:    Drospirenone-Estetrol (NEXTSTELLIS ) 3-14.2 MG TABS, Take 1 tablet by mouth daily., Disp: 84 tablet, Rfl: 0   Ferrous Sulfate (IRON PO), Take by mouth., Disp: , Rfl:    Multiple Vitamins-Minerals (WOMENS MULTIVITAMIN + COLLAGEN PO), Take by mouth., Disp: , Rfl:    Probiotic Product (PROBIOTIC PO), Take by mouth., Disp: , Rfl:    sertraline  (ZOLOFT ) 100 MG tablet, Take 100 mg by mouth at bedtime., Disp: , Rfl:      ROS:  Review of Systems  Constitutional:  Negative for fatigue, fever and unexpected weight change.  Respiratory:  Negative for cough, shortness of breath  and wheezing.   Cardiovascular:  Negative for chest pain, palpitations and leg swelling.  Gastrointestinal:  Negative for blood in stool, constipation, diarrhea, nausea and vomiting.  Endocrine: Negative for cold intolerance, heat intolerance and polyuria.  Genitourinary:  Negative for dyspareunia, dysuria, flank pain, frequency, genital sores, hematuria, menstrual problem, pelvic pain, urgency, vaginal bleeding, vaginal discharge and vaginal pain.  Musculoskeletal:  Negative for back pain, joint swelling and myalgias.  Skin:  Negative for rash.  Neurological:  Negative for dizziness, syncope, light-headedness, numbness and headaches.  Hematological:  Negative for adenopathy.  Psychiatric/Behavioral:  Negative for agitation, confusion, sleep disturbance and suicidal ideas. The patient is not nervous/anxious.    BREAST: No symptoms   Objective: There were no vitals taken for this visit.   Physical Exam Constitutional:      Appearance: She is well-developed.  Genitourinary:     Vulva normal.     Genitourinary Comments: RT BREAST MASS NOT PALPATED     Right Labia: No rash, tenderness or lesions.    Left Labia: No tenderness, lesions or rash.    No vaginal discharge, erythema or tenderness.      Right Adnexa: not tender and no mass present.    Left Adnexa: not tender and no mass present.    No cervical friability or polyp.     Uterus is not enlarged or tender.  Breasts:    Right: No mass, nipple discharge, skin change or tenderness.     Left: Mass present. No nipple discharge, skin change or tenderness.  Neck:     Thyroid : No thyromegaly.  Cardiovascular:     Rate and Rhythm: Normal rate and regular rhythm.     Heart sounds: Normal heart sounds. No murmur heard. Pulmonary:     Effort: Pulmonary effort is normal.     Breath sounds: Normal breath sounds.  Chest:    Abdominal:     Palpations: Abdomen is soft.     Tenderness: There is no abdominal tenderness. There is no  guarding or rebound.  Musculoskeletal:        General: Normal range of motion.     Cervical back: Normal range of motion.  Lymphadenopathy:     Cervical: No cervical adenopathy.  Neurological:     General: No focal deficit present.     Mental Status: She is alert and oriented to person, place, and time.     Cranial Nerves: No cranial nerve deficit.  Skin:    General: Skin is warm and dry.  Psychiatric:        Mood and Affect: Mood normal.        Behavior: Behavior normal.  Thought Content: Thought content normal.        Judgment: Judgment normal.  Vitals reviewed.     Assessment/Plan: Encounter for annual routine gynecological examination  Cervical cancer screening - Plan: Cytology - PAP  Screening for STD (sexually transmitted disease) - Plan: Cytology - PAP  Dysplasia of cervix, low grade (CIN 1) - Plan: Cytology - PAP; repeat pap today, will f/u with results  Screening for HPV (human papillomavirus) - Plan: Cytology - PAP  Encounter for surveillance of contraceptive pills - Plan: Drospirenone-Estetrol (NEXTSTELLIS ) 3-14.2 MG TABS; OCP RF  Family history of breast cancer--MyRisk testing discussed and handout given. Pt to discuss with aunt who is going through metastatic breast cancer recurrence. Pt to f/u for testing prn.   Mass of breast, unspecified laterality - Plan: US  LIMITED ULTRASOUND INCLUDING AXILLA RIGHT BREAST, US  LIMITED ULTRASOUND INCLUDING AXILLA LEFT BREAST; 6 mo f/u breast imaging recommended; pt to call to schedule u/s bilat.   No orders of the defined types were placed in this encounter.            GYN counsel adequate intake of calcium and vitamin D, diet and exercise     F/U  No follow-ups on file.  Quentavious Rittenhouse B. Jael Kostick, PA-C 09/27/2023 8:56 PM

## 2023-09-28 ENCOUNTER — Other Ambulatory Visit (HOSPITAL_COMMUNITY)
Admission: RE | Admit: 2023-09-28 | Discharge: 2023-09-28 | Disposition: A | Discharge status: Home / Self Care | Source: Ambulatory Visit | Attending: Obstetrics and Gynecology | Admitting: Obstetrics and Gynecology

## 2023-09-28 ENCOUNTER — Encounter: Payer: Self-pay | Admitting: Obstetrics and Gynecology

## 2023-09-28 ENCOUNTER — Ambulatory Visit (INDEPENDENT_AMBULATORY_CARE_PROVIDER_SITE_OTHER): Payer: Self-pay | Admitting: Obstetrics and Gynecology

## 2023-09-28 VITALS — BP 112/77 | HR 78 | Ht 67.0 in | Wt 158.0 lb

## 2023-09-28 DIAGNOSIS — Z113 Encounter for screening for infections with a predominantly sexual mode of transmission: Secondary | ICD-10-CM

## 2023-09-28 DIAGNOSIS — N870 Mild cervical dysplasia: Secondary | ICD-10-CM | POA: Insufficient documentation

## 2023-09-28 DIAGNOSIS — N63 Unspecified lump in unspecified breast: Secondary | ICD-10-CM

## 2023-09-28 DIAGNOSIS — Z803 Family history of malignant neoplasm of breast: Secondary | ICD-10-CM

## 2023-09-28 DIAGNOSIS — Z124 Encounter for screening for malignant neoplasm of cervix: Secondary | ICD-10-CM | POA: Diagnosis present

## 2023-09-28 DIAGNOSIS — Z3041 Encounter for surveillance of contraceptive pills: Secondary | ICD-10-CM

## 2023-09-28 DIAGNOSIS — Z01419 Encounter for gynecological examination (general) (routine) without abnormal findings: Secondary | ICD-10-CM

## 2023-09-28 MED ORDER — NEXTSTELLIS 3-14.2 MG PO TABS
1.0000 | ORAL_TABLET | Freq: Every day | ORAL | 3 refills | Status: AC
Start: 2023-09-28 — End: ?

## 2023-09-28 NOTE — Patient Instructions (Signed)
 I value your feedback and you entrusting [place] with your care. If you get a [facility] patient survey, I would appreciate you taking the time to let us know about your experience today. Thank you!  [facility] ([place])--[phone]

## 2023-10-04 LAB — CYTOLOGY - PAP
Diagnosis: NEGATIVE
Diagnosis: REACTIVE

## 2023-10-06 ENCOUNTER — Ambulatory Visit
Admission: RE | Admit: 2023-10-06 | Discharge: 2023-10-06 | Disposition: A | Discharge status: Home / Self Care | Source: Ambulatory Visit | Attending: Obstetrics and Gynecology | Admitting: Obstetrics and Gynecology

## 2023-10-06 ENCOUNTER — Ambulatory Visit: Payer: Self-pay | Admitting: Obstetrics and Gynecology

## 2023-10-06 DIAGNOSIS — Z803 Family history of malignant neoplasm of breast: Secondary | ICD-10-CM | POA: Diagnosis present

## 2023-10-06 DIAGNOSIS — N6321 Unspecified lump in the left breast, upper outer quadrant: Secondary | ICD-10-CM | POA: Diagnosis not present

## 2023-10-06 DIAGNOSIS — N6314 Unspecified lump in the right breast, lower inner quadrant: Secondary | ICD-10-CM | POA: Insufficient documentation

## 2023-10-06 DIAGNOSIS — N63 Unspecified lump in unspecified breast: Secondary | ICD-10-CM | POA: Diagnosis present

## 2023-10-06 DIAGNOSIS — Z1239 Encounter for other screening for malignant neoplasm of breast: Secondary | ICD-10-CM | POA: Diagnosis not present

## 2023-10-15 ENCOUNTER — Other Ambulatory Visit: Payer: Self-pay

## 2023-10-15 DIAGNOSIS — F419 Anxiety disorder, unspecified: Secondary | ICD-10-CM

## 2023-10-15 MED ORDER — SERTRALINE HCL 100 MG PO TABS: 150.0000 mg | ORAL_TABLET | Freq: Every day | ORAL | 3 refills | Status: AC

## 2023-11-16 ENCOUNTER — Other Ambulatory Visit: Payer: Self-pay | Admitting: Obstetrics and Gynecology

## 2023-11-16 DIAGNOSIS — Z3041 Encounter for surveillance of contraceptive pills: Secondary | ICD-10-CM

## 2024-01-30 ENCOUNTER — Encounter: Payer: Self-pay | Admitting: Obstetrics and Gynecology

## 2024-02-22 IMAGING — US US BREAST*L* LIMITED INC AXILLA
1 series · 8 of 8 positions shown · non-contrast
Comparison: None.

CLINICAL DATA: Left breast area of palpable concern felt by the
patient.

Right breast area of palpable concern felt by patient's clinician.
EXAM:
ULTRASOUND OF THE BILATERAL BREAST

[Series 1: us breast*left* limited inc axilla · 0.06mm/px · 8 of 8 slices shown]
[im 1/8]
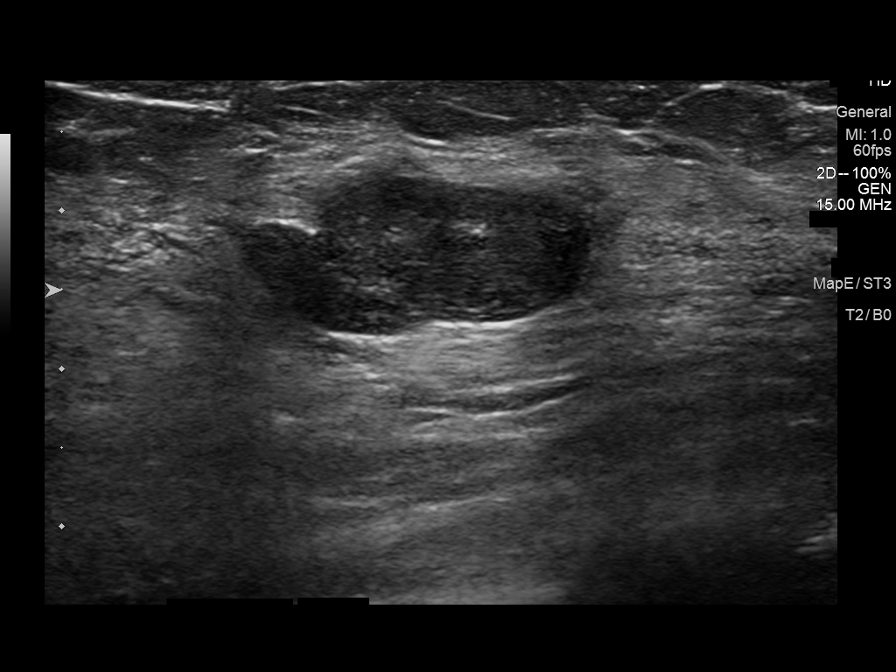
[im 2/8]
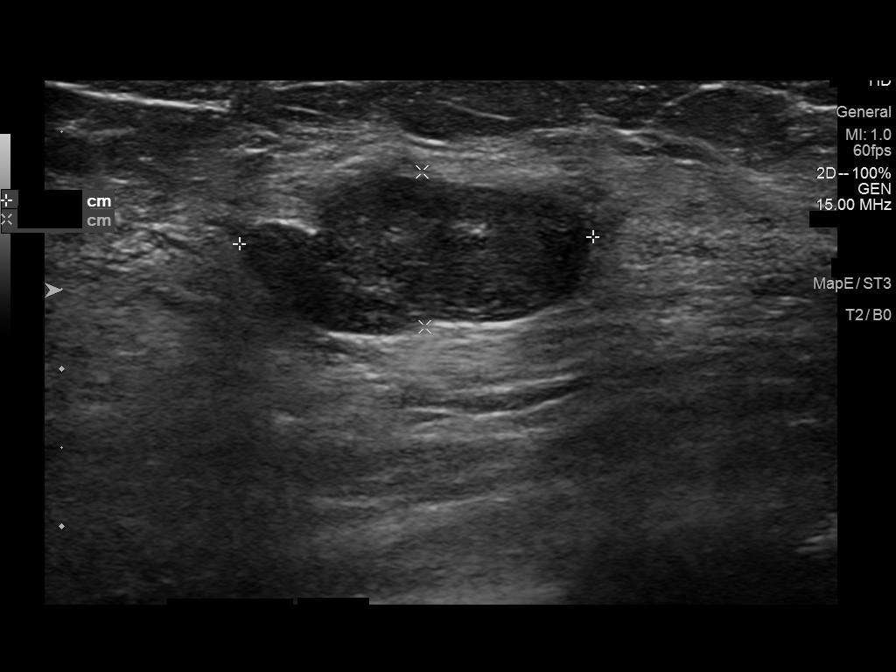
[im 3/8]
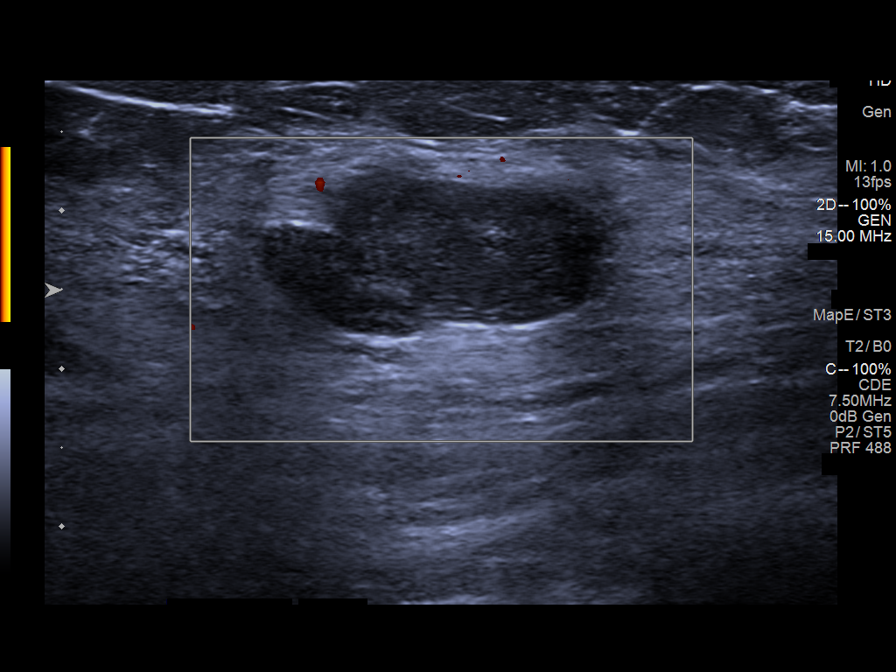
[im 4/8]
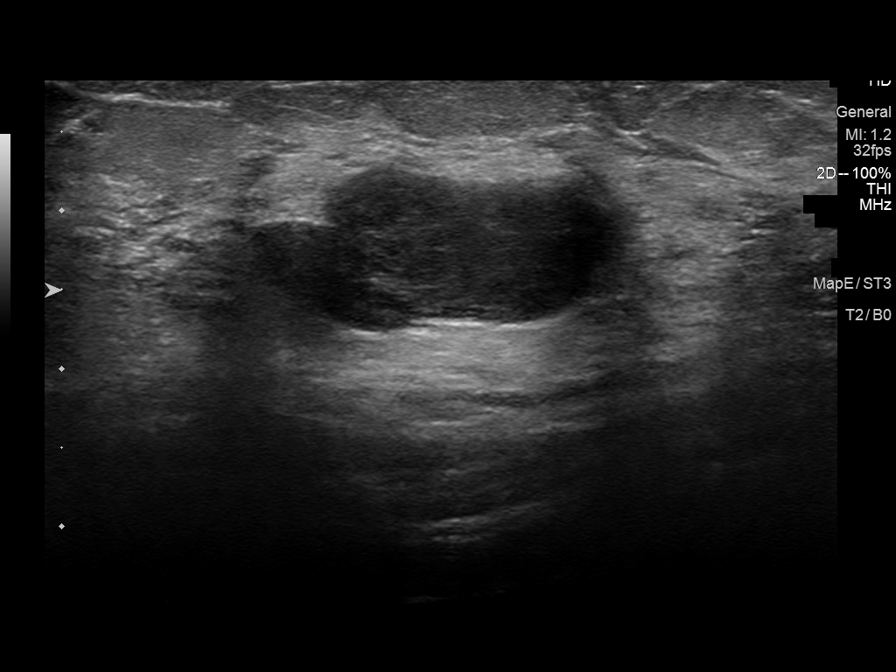
[im 5/8]
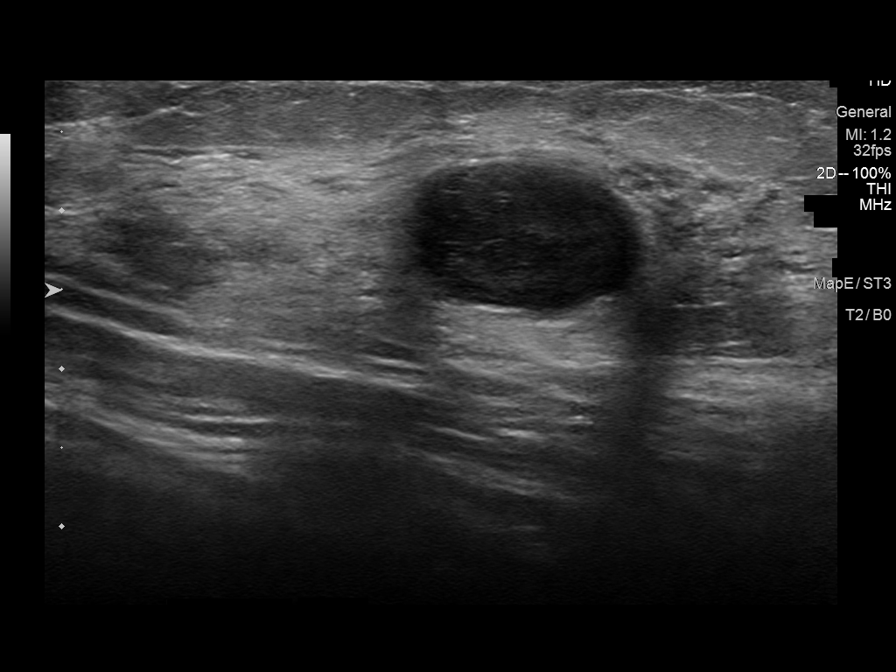
[im 6/8]
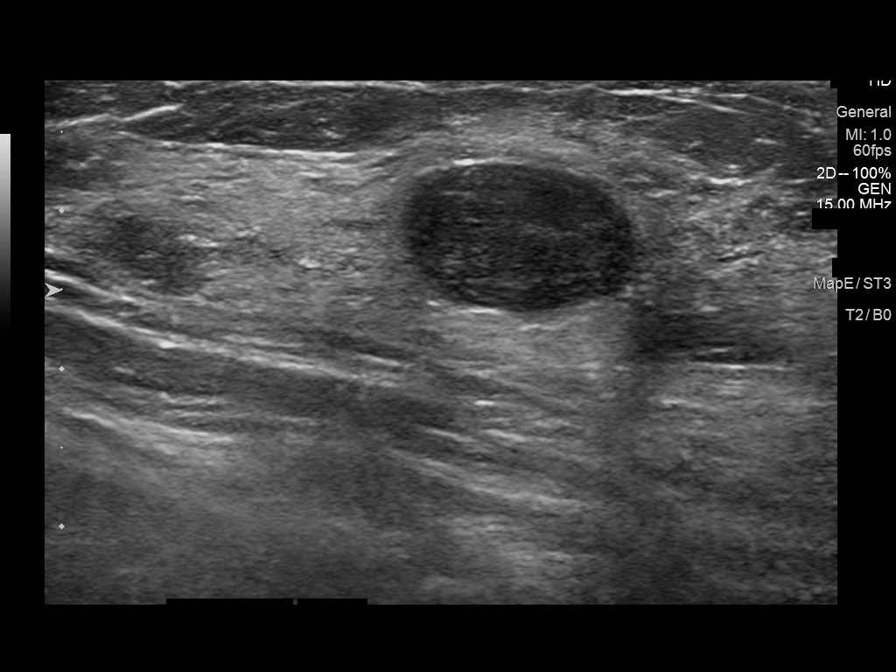
[im 7/8]
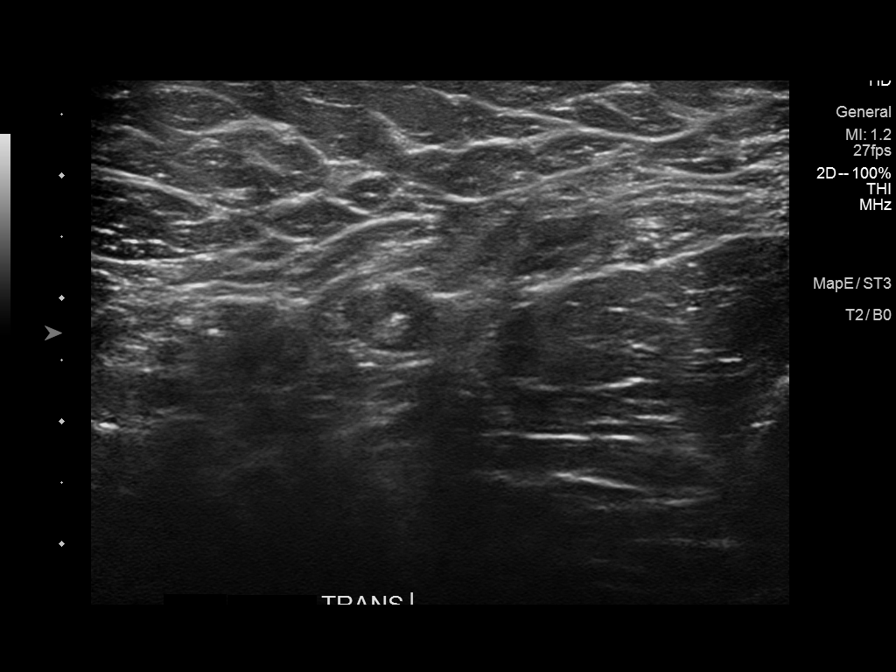
[im 8/8]
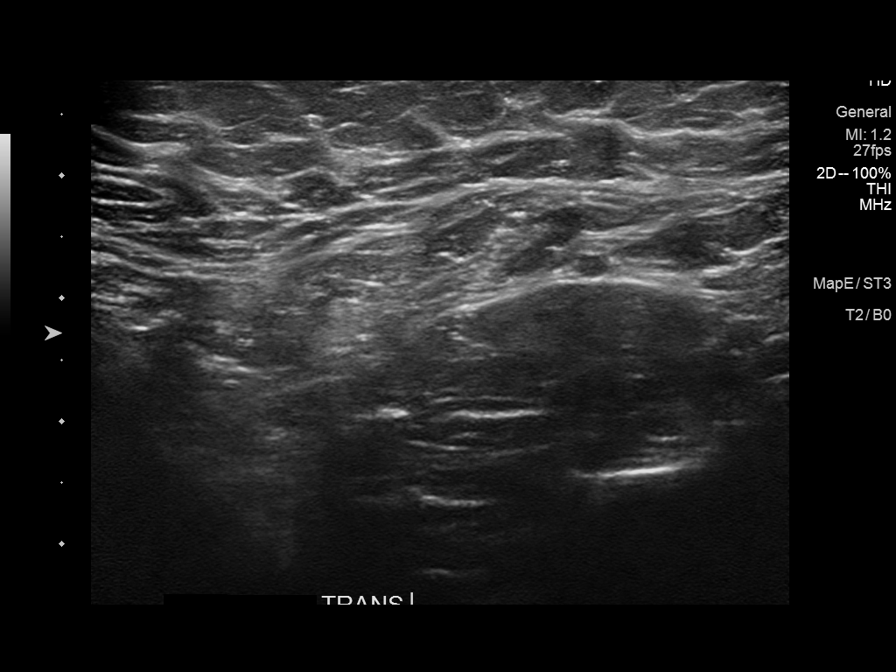

[8 of 8 positions shown; findings below may reference images not displayed]

FINDINGS: Targeted left breast ultrasound of the area of palpable concern is
performed, showing 10 o'clock 4 cm from the nipple hypoechoic
circumscribed lobulated mass measuring 2.2 x 1.0 x 1.4 cm. There is
no evidence of left axillary lymphadenopathy.

Targeted right breast ultrasound of the area of palpable concern is
performed, demonstrating 6:30 o'clock 2 cm from the nipple
hypoechoic circumscribed mass measuring 0.9 x 0.9 x 1.2 cm.
Immediately adjacent to it there is a second smaller similar in
appearance mass measuring 0.4 x 0.5 x 0.3 cm. There is no evidence
of right axillary lymphadenopathy.
IMPRESSION: 1. Left breast 10 o'clock solid palpable mass, for which
ultrasound-guided core needle biopsy is recommended.
2. Right breast 6:30 o'clock 2 adjacent solid palpable masses, for
which ultrasound-guided core needle biopsy is recommended. Given the
immediate proximity of these 2 masses, and their similarity in
appearance, these may be sampled simultaneously.

RECOMMENDATION:
Ultrasound-guided core needle biopsy of the bilateral breasts.

I have discussed the findings and recommendations with the patient.
If applicable, a reminder letter will be sent to the patient
regarding the next appointment.

BI-RADS CATEGORY  4: Suspicious.

## 2024-02-22 IMAGING — US US BREAST*R* LIMITED INC AXILLA
1 series · 13 of 16 positions shown · non-contrast
Comparison: None.

CLINICAL DATA: Left breast area of palpable concern felt by the
patient.

Right breast area of palpable concern felt by patient's clinician.
EXAM:
ULTRASOUND OF THE BILATERAL BREAST

[Series 1: us breast*right* limited inc axilla · 0.05mm/px · 13 of 16 slices shown]
[im 1/16]
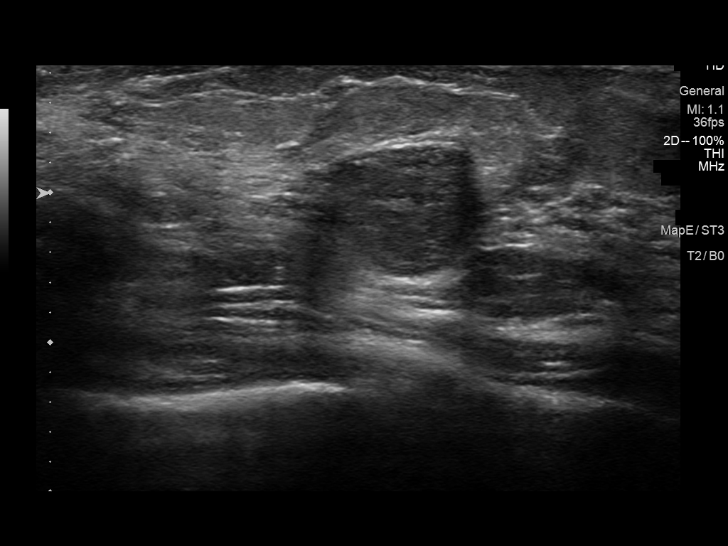
[im 2/16]
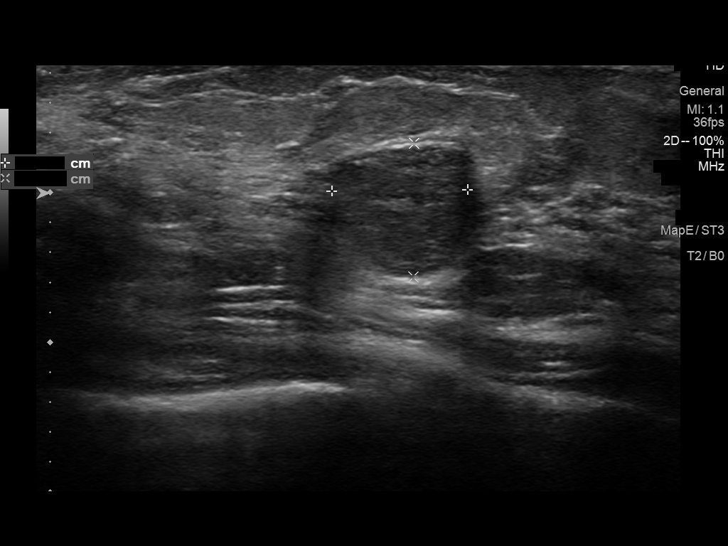
[im 4/16]
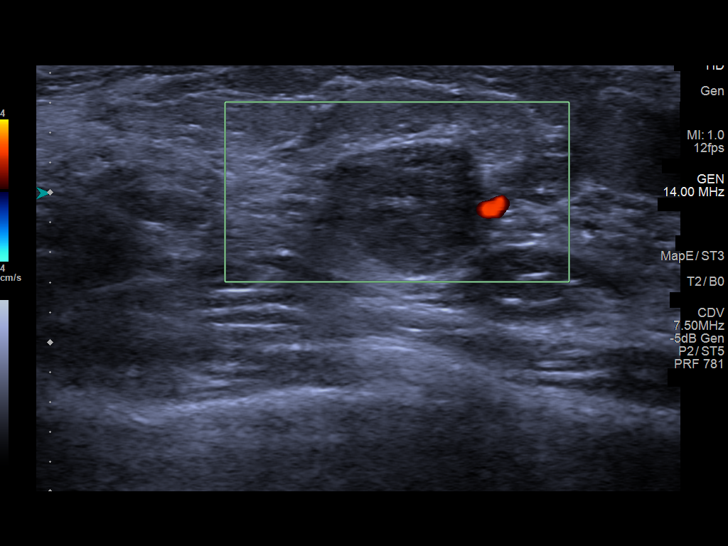
[im 5/16]
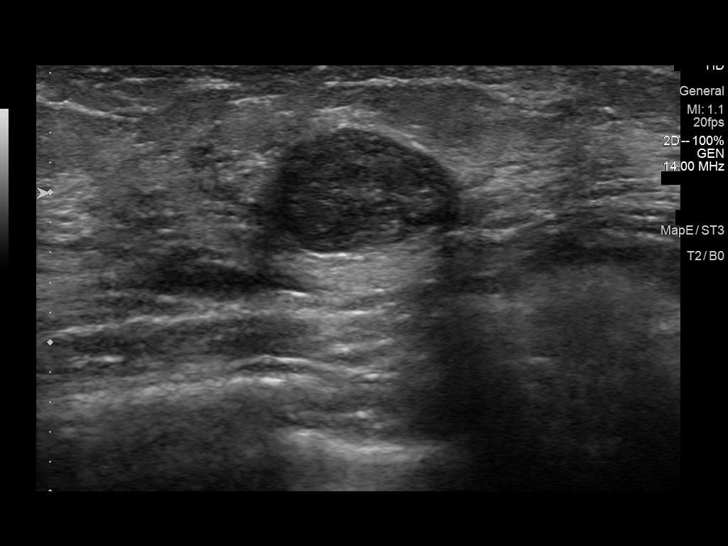
[im 6/16]
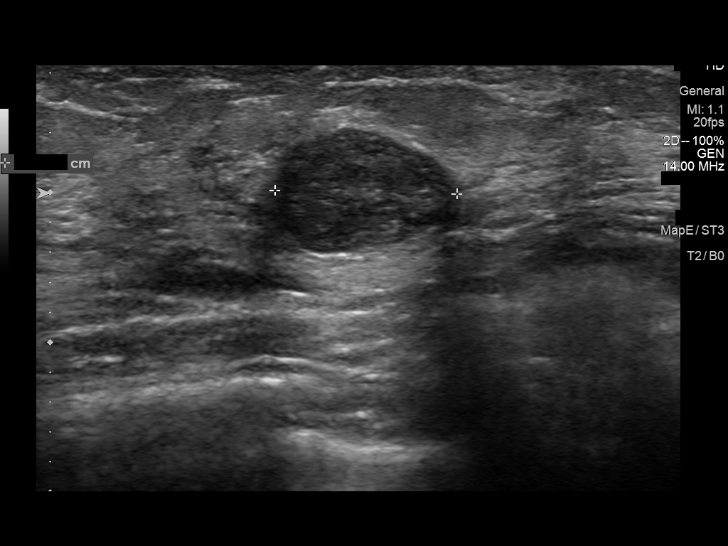
[im 7/16]
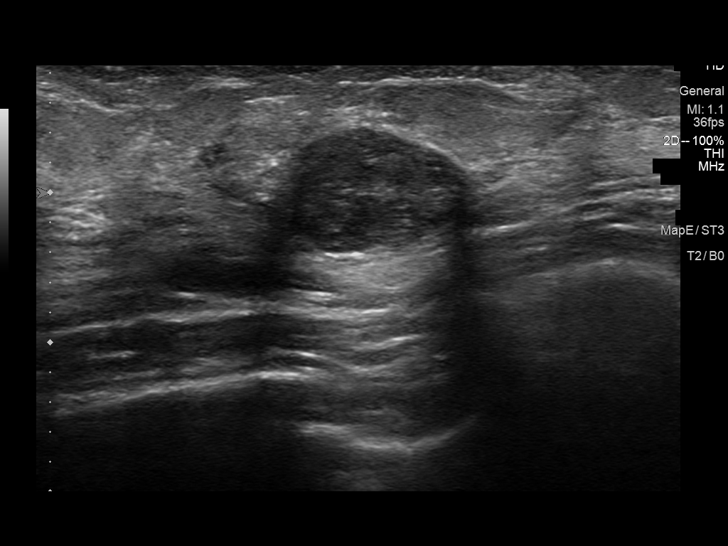
[im 9/16]
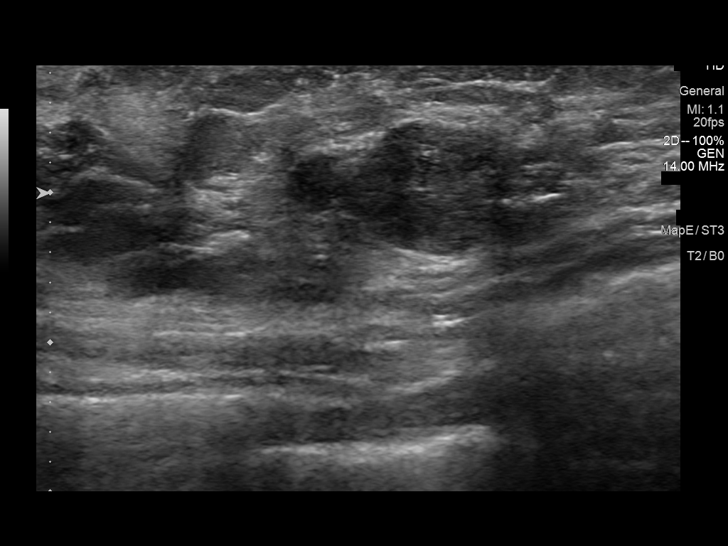
[im 10/16]
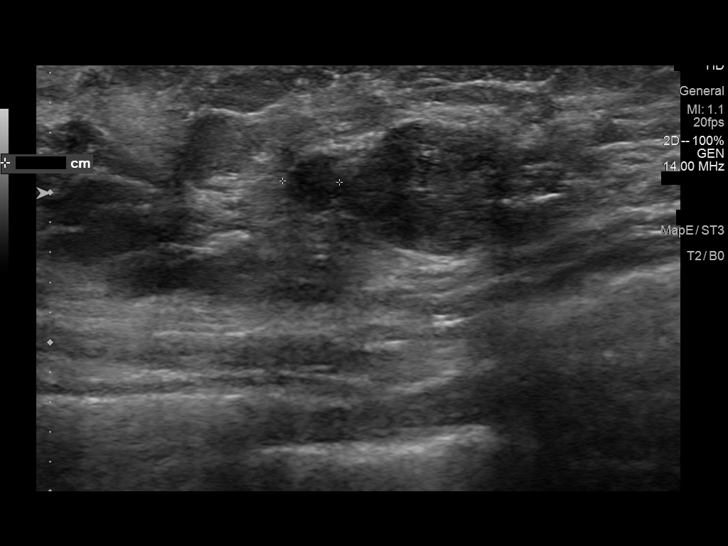
[im 11/16]
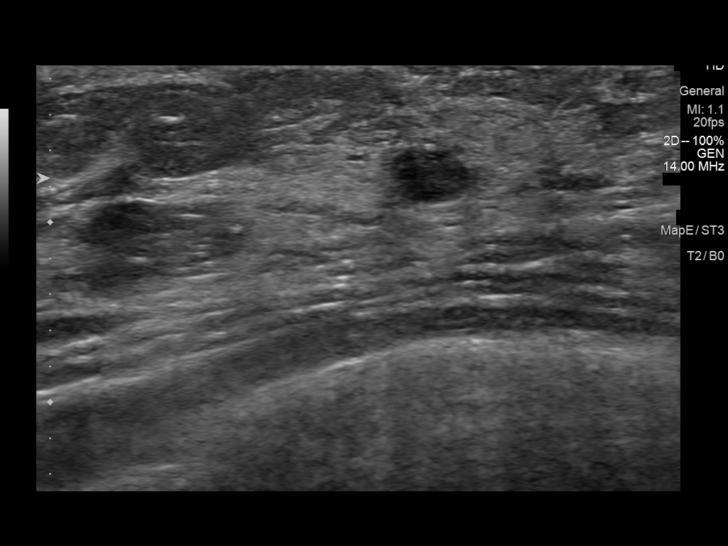
[im 12/16]
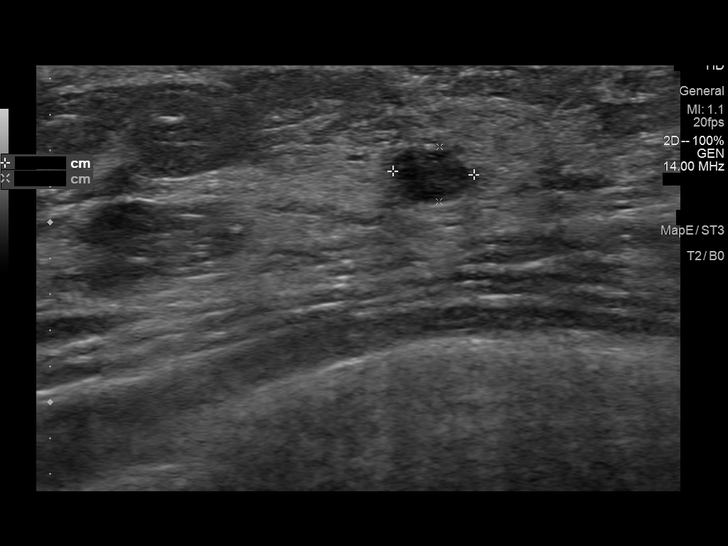
[im 13/16]
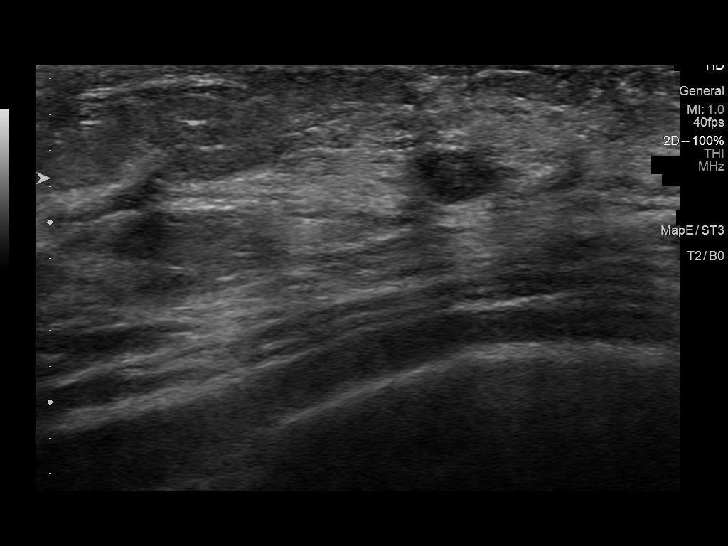
[im 15/16]
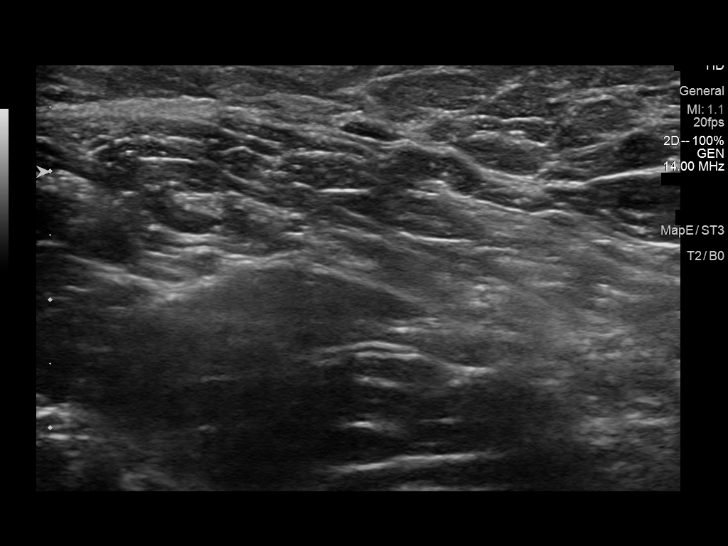
[im 16/16]
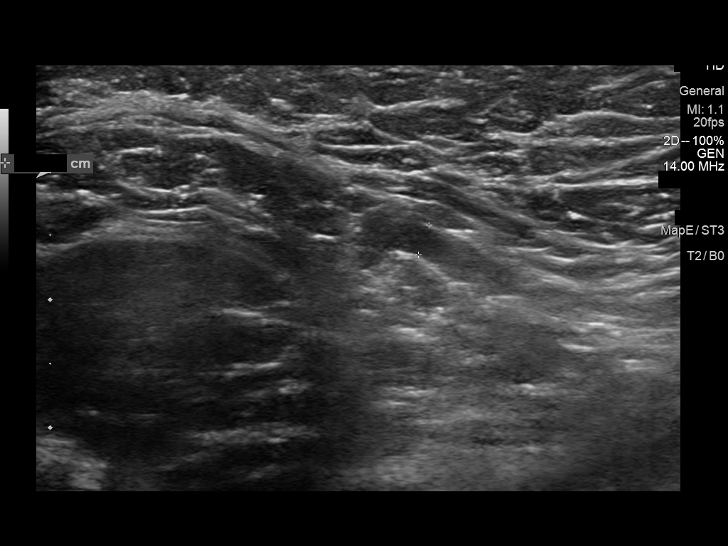

[13 of 16 positions shown; findings below may reference images not displayed]

FINDINGS: Targeted left breast ultrasound of the area of palpable concern is
performed, showing 10 o'clock 4 cm from the nipple hypoechoic
circumscribed lobulated mass measuring 2.2 x 1.0 x 1.4 cm. There is
no evidence of left axillary lymphadenopathy.

Targeted right breast ultrasound of the area of palpable concern is
performed, demonstrating 6:30 o'clock 2 cm from the nipple
hypoechoic circumscribed mass measuring 0.9 x 0.9 x 1.2 cm.
Immediately adjacent to it there is a second smaller similar in
appearance mass measuring 0.4 x 0.5 x 0.3 cm. There is no evidence
of right axillary lymphadenopathy.
IMPRESSION: 1. Left breast 10 o'clock solid palpable mass, for which
ultrasound-guided core needle biopsy is recommended.
2. Right breast 6:30 o'clock 2 adjacent solid palpable masses, for
which ultrasound-guided core needle biopsy is recommended. Given the
immediate proximity of these 2 masses, and their similarity in
appearance, these may be sampled simultaneously.

RECOMMENDATION:
Ultrasound-guided core needle biopsy of the bilateral breasts.

I have discussed the findings and recommendations with the patient.
If applicable, a reminder letter will be sent to the patient
regarding the next appointment.

BI-RADS CATEGORY  4: Suspicious.
